# Patient Record
Sex: Male | Born: 1970 | Race: White | Hispanic: No | Marital: Single | Smoking: Current every day smoker
Health system: Southern US, Community
[De-identification: ages and names within clinical notes are randomized; demographics above are authoritative.]

## PROBLEM LIST (undated history)

## (undated) DIAGNOSIS — I2510 Atherosclerotic heart disease of native coronary artery without angina pectoris: Secondary | ICD-10-CM

## (undated) DIAGNOSIS — E7800 Pure hypercholesterolemia, unspecified: Secondary | ICD-10-CM

## (undated) DIAGNOSIS — E119 Type 2 diabetes mellitus without complications: Secondary | ICD-10-CM

## (undated) DIAGNOSIS — J449 Chronic obstructive pulmonary disease, unspecified: Secondary | ICD-10-CM

## (undated) DIAGNOSIS — J45909 Unspecified asthma, uncomplicated: Secondary | ICD-10-CM

## (undated) DIAGNOSIS — I10 Essential (primary) hypertension: Secondary | ICD-10-CM

## (undated) HISTORY — PX: CARDIAC CATHETERIZATION: SHX172

---

## 2017-12-17 ENCOUNTER — Inpatient Hospital Stay
Admission: EM | Admit: 2017-12-17 | Discharge: 2017-12-18 | DRG: 683 | Disposition: A | Discharge status: Home / Self Care | Payer: Medicaid Other | Attending: Internal Medicine | Admitting: Internal Medicine

## 2017-12-17 ENCOUNTER — Encounter: Payer: Self-pay | Admitting: Emergency Medicine

## 2017-12-17 ENCOUNTER — Emergency Department: Payer: Medicaid Other

## 2017-12-17 ENCOUNTER — Other Ambulatory Visit: Payer: Self-pay

## 2017-12-17 ENCOUNTER — Inpatient Hospital Stay: Payer: Medicaid Other

## 2017-12-17 DIAGNOSIS — I2510 Atherosclerotic heart disease of native coronary artery without angina pectoris: Secondary | ICD-10-CM | POA: Diagnosis present

## 2017-12-17 DIAGNOSIS — E861 Hypovolemia: Secondary | ICD-10-CM | POA: Diagnosis present

## 2017-12-17 DIAGNOSIS — Z716 Tobacco abuse counseling: Secondary | ICD-10-CM

## 2017-12-17 DIAGNOSIS — W1830XA Fall on same level, unspecified, initial encounter: Secondary | ICD-10-CM | POA: Diagnosis present

## 2017-12-17 DIAGNOSIS — E785 Hyperlipidemia, unspecified: Secondary | ICD-10-CM | POA: Diagnosis present

## 2017-12-17 DIAGNOSIS — E119 Type 2 diabetes mellitus without complications: Secondary | ICD-10-CM | POA: Diagnosis present

## 2017-12-17 DIAGNOSIS — F1410 Cocaine abuse, uncomplicated: Secondary | ICD-10-CM | POA: Diagnosis present

## 2017-12-17 DIAGNOSIS — E7800 Pure hypercholesterolemia, unspecified: Secondary | ICD-10-CM | POA: Diagnosis present

## 2017-12-17 DIAGNOSIS — I9589 Other hypotension: Secondary | ICD-10-CM | POA: Diagnosis present

## 2017-12-17 DIAGNOSIS — I10 Essential (primary) hypertension: Secondary | ICD-10-CM | POA: Diagnosis present

## 2017-12-17 DIAGNOSIS — E669 Obesity, unspecified: Secondary | ICD-10-CM | POA: Diagnosis present

## 2017-12-17 DIAGNOSIS — J449 Chronic obstructive pulmonary disease, unspecified: Secondary | ICD-10-CM | POA: Diagnosis present

## 2017-12-17 DIAGNOSIS — K529 Noninfective gastroenteritis and colitis, unspecified: Secondary | ICD-10-CM | POA: Diagnosis present

## 2017-12-17 DIAGNOSIS — N179 Acute kidney failure, unspecified: Principal | ICD-10-CM | POA: Diagnosis present

## 2017-12-17 DIAGNOSIS — M79601 Pain in right arm: Secondary | ICD-10-CM | POA: Diagnosis present

## 2017-12-17 DIAGNOSIS — E871 Hypo-osmolality and hyponatremia: Secondary | ICD-10-CM | POA: Diagnosis present

## 2017-12-17 DIAGNOSIS — F17210 Nicotine dependence, cigarettes, uncomplicated: Secondary | ICD-10-CM | POA: Diagnosis present

## 2017-12-17 DIAGNOSIS — E860 Dehydration: Secondary | ICD-10-CM | POA: Diagnosis present

## 2017-12-17 DIAGNOSIS — M25559 Pain in unspecified hip: Secondary | ICD-10-CM

## 2017-12-17 DIAGNOSIS — R55 Syncope and collapse: Secondary | ICD-10-CM | POA: Diagnosis present

## 2017-12-17 DIAGNOSIS — M25519 Pain in unspecified shoulder: Secondary | ICD-10-CM

## 2017-12-17 DIAGNOSIS — M25552 Pain in left hip: Secondary | ICD-10-CM | POA: Diagnosis present

## 2017-12-17 HISTORY — DX: Essential (primary) hypertension: I10

## 2017-12-17 HISTORY — DX: Chronic obstructive pulmonary disease, unspecified: J44.9

## 2017-12-17 HISTORY — DX: Pure hypercholesterolemia, unspecified: E78.00

## 2017-12-17 HISTORY — DX: Atherosclerotic heart disease of native coronary artery without angina pectoris: I25.10

## 2017-12-17 HISTORY — DX: Unspecified asthma, uncomplicated: J45.909

## 2017-12-17 HISTORY — DX: Type 2 diabetes mellitus without complications: E11.9

## 2017-12-17 HISTORY — DX: Cystic fibrosis, unspecified: E84.9

## 2017-12-17 LAB — COMPREHENSIVE METABOLIC PANEL
ALT: 36 U/L (ref 0–44)
ANION GAP: 11 (ref 5–15)
AST: 48 U/L — ABNORMAL HIGH (ref 15–41)
Albumin: 3.6 g/dL (ref 3.5–5.0)
Alkaline Phosphatase: 45 U/L (ref 38–126)
BUN: 74 mg/dL — ABNORMAL HIGH (ref 6–20)
CHLORIDE: 96 mmol/L — AB (ref 98–111)
CO2: 25 mmol/L (ref 22–32)
Calcium: 8.2 mg/dL — ABNORMAL LOW (ref 8.9–10.3)
Creatinine, Ser: 2.62 mg/dL — ABNORMAL HIGH (ref 0.61–1.24)
GFR, EST AFRICAN AMERICAN: 32 mL/min — AB (ref >60)
GFR, EST NON AFRICAN AMERICAN: 28 mL/min — AB (ref >60)
Glucose, Bld: 216 mg/dL — ABNORMAL HIGH (ref 70–99)
POTASSIUM: 3.9 mmol/L (ref 3.5–5.1)
Sodium: 132 mmol/L — ABNORMAL LOW (ref 135–145)
TOTAL PROTEIN: 6.6 g/dL (ref 6.5–8.1)
Total Bilirubin: 0.9 mg/dL (ref 0.3–1.2)

## 2017-12-17 LAB — GLUCOSE, CAPILLARY
GLUCOSE-CAPILLARY: 109 mg/dL — AB (ref 70–99)
GLUCOSE-CAPILLARY: 173 mg/dL — AB (ref 70–99)

## 2017-12-17 LAB — CBC WITH DIFFERENTIAL/PLATELET
BASOS ABS: 0 10*3/uL (ref 0–0.1)
BASOS PCT: 0 %
Eosinophils Absolute: 0.3 10*3/uL (ref 0–0.7)
Eosinophils Relative: 3 %
HCT: 35.5 % — ABNORMAL LOW (ref 40.0–52.0)
HEMOGLOBIN: 12.6 g/dL — AB (ref 13.0–18.0)
Lymphocytes Relative: 24 %
Lymphs Abs: 2.4 10*3/uL (ref 1.0–3.6)
MCH: 32.8 pg (ref 26.0–34.0)
MCHC: 35.3 g/dL (ref 32.0–36.0)
MCV: 92.9 fL (ref 80.0–100.0)
Monocytes Absolute: 1.2 10*3/uL — ABNORMAL HIGH (ref 0.2–1.0)
Monocytes Relative: 11 %
NEUTROS ABS: 6.3 10*3/uL (ref 1.4–6.5)
Neutrophils Relative %: 62 %
Platelets: 213 10*3/uL (ref 150–440)
RBC: 3.83 MIL/uL — ABNORMAL LOW (ref 4.40–5.90)
RDW: 12.9 % (ref 11.5–14.5)
WBC: 10.2 10*3/uL (ref 3.8–10.6)

## 2017-12-17 LAB — URINALYSIS, COMPLETE (UACMP) WITH MICROSCOPIC
BILIRUBIN URINE: NEGATIVE
Bacteria, UA: NONE SEEN
Glucose, UA: 50 mg/dL — AB
HGB URINE DIPSTICK: NEGATIVE
KETONES UR: NEGATIVE mg/dL
LEUKOCYTES UA: NEGATIVE
NITRITE: NEGATIVE
PH: 5 (ref 5.0–8.0)
Protein, ur: NEGATIVE mg/dL
SPECIFIC GRAVITY, URINE: 1.014 (ref 1.005–1.030)

## 2017-12-17 LAB — URINE DRUG SCREEN, QUALITATIVE (ARMC ONLY)
AMPHETAMINES, UR SCREEN: NOT DETECTED
BENZODIAZEPINE, UR SCRN: NOT DETECTED
Barbiturates, Ur Screen: NOT DETECTED
Cannabinoid 50 Ng, Ur ~~LOC~~: NOT DETECTED
Cocaine Metabolite,Ur ~~LOC~~: POSITIVE — AB
MDMA (ECSTASY) UR SCREEN: NOT DETECTED
METHADONE SCREEN, URINE: NOT DETECTED
Opiate, Ur Screen: NOT DETECTED
Phencyclidine (PCP) Ur S: NOT DETECTED
TRICYCLIC, UR SCREEN: NOT DETECTED

## 2017-12-17 LAB — TROPONIN I

## 2017-12-17 LAB — LACTIC ACID, PLASMA: LACTIC ACID, VENOUS: 1.5 mmol/L (ref 0.5–1.9)

## 2017-12-17 MED ORDER — NICOTINE 21 MG/24HR TD PT24
21.0000 mg | MEDICATED_PATCH | Freq: Once | TRANSDERMAL | Status: DC
  Administered 2017-12-17: 21 mg via TRANSDERMAL
  Filled 2017-12-17: qty 1

## 2017-12-17 MED ORDER — INFLUENZA VAC SPLIT QUAD 0.5 ML IM SUSY: 0.5000 mL | PREFILLED_SYRINGE | INTRAMUSCULAR | Status: DC

## 2017-12-17 MED ORDER — ACETAMINOPHEN 650 MG RE SUPP: 650.0000 mg | Freq: Four times a day (QID) | RECTAL | Status: DC | PRN

## 2017-12-17 MED ORDER — SODIUM CHLORIDE 0.9 % IV BOLUS
500.0000 mL | Freq: Once | INTRAVENOUS | Status: AC
  Administered 2017-12-17: 500 mL via INTRAVENOUS

## 2017-12-17 MED ORDER — NICOTINE 21 MG/24HR TD PT24
21.0000 mg | MEDICATED_PATCH | Freq: Every day | TRANSDERMAL | Status: DC
  Filled 2017-12-17: qty 1

## 2017-12-17 MED ORDER — NICOTINE 14 MG/24HR TD PT24: 14.0000 mg | MEDICATED_PATCH | Freq: Every day | TRANSDERMAL | Status: DC

## 2017-12-17 MED ORDER — ENOXAPARIN SODIUM 40 MG/0.4ML ~~LOC~~ SOLN
40.0000 mg | SUBCUTANEOUS | Status: DC
  Administered 2017-12-17: 40 mg via SUBCUTANEOUS
  Filled 2017-12-17: qty 0.4

## 2017-12-17 MED ORDER — ONDANSETRON HCL 4 MG PO TABS: 4.0000 mg | ORAL_TABLET | Freq: Four times a day (QID) | ORAL | Status: DC | PRN

## 2017-12-17 MED ORDER — ONDANSETRON HCL 4 MG/2ML IJ SOLN: 4.0000 mg | Freq: Four times a day (QID) | INTRAMUSCULAR | Status: DC | PRN

## 2017-12-17 MED ORDER — INSULIN ASPART 100 UNIT/ML ~~LOC~~ SOLN: 0.0000 [IU] | Freq: Every day | SUBCUTANEOUS | Status: DC

## 2017-12-17 MED ORDER — ACETAMINOPHEN 325 MG PO TABS
650.0000 mg | ORAL_TABLET | Freq: Four times a day (QID) | ORAL | Status: DC | PRN
  Administered 2017-12-17 – 2017-12-18 (×2): 650 mg via ORAL
  Filled 2017-12-17 (×2): qty 2

## 2017-12-17 MED ORDER — MIDODRINE HCL 5 MG PO TABS
5.0000 mg | ORAL_TABLET | Freq: Three times a day (TID) | ORAL | Status: DC
  Administered 2017-12-17 – 2017-12-18 (×2): 5 mg via ORAL
  Filled 2017-12-17 (×3): qty 1

## 2017-12-17 MED ORDER — INSULIN ASPART 100 UNIT/ML ~~LOC~~ SOLN: 0.0000 [IU] | Freq: Three times a day (TID) | SUBCUTANEOUS | Status: DC

## 2017-12-17 MED ORDER — LORAZEPAM 2 MG/ML IJ SOLN: 1.0000 mg | INTRAMUSCULAR | Status: DC | PRN

## 2017-12-17 MED ORDER — ENOXAPARIN SODIUM 40 MG/0.4ML ~~LOC~~ SOLN: 30.0000 mg | SUBCUTANEOUS | Status: DC

## 2017-12-17 MED ORDER — SODIUM CHLORIDE 0.9 % IV SOLN
INTRAVENOUS | Status: DC
  Administered 2017-12-17 – 2017-12-18 (×3): via INTRAVENOUS

## 2017-12-17 MED ORDER — SODIUM CHLORIDE 0.9 % IV BOLUS
1000.0000 mL | Freq: Once | INTRAVENOUS | Status: AC
  Administered 2017-12-17: 1000 mL via INTRAVENOUS

## 2017-12-17 NOTE — Progress Notes (Signed)
Anticoagulation monitoring(Lovenox):  47yo  male ordered Lovenox 30 mg Q24h for DVT prevention.  Filed Weights   12/17/17 1114  Weight: 300 lb (136.1 kg)   BMI 36.53   Lab Results  Component Value Date   CREATININE 2.62 (H) 12/17/2017   Estimated Creatinine Clearance: 54.2 mL/min (A) (by C-G formula based on SCr of 2.62 mg/dL (H)). Hemoglobin & Hematocrit     Component Value Date/Time   HGB 12.6 (L) 12/17/2017 1146   HCT 35.5 (L) 12/17/2017 1146     Per Protocol for Patient with estCrcl > 30 ml/min and BMI < 40, will transition to Lovenox 40 mg Q24h.     Clovia CuffLisa Edmar Blankenburg, PharmD, BCPS 12/17/2017 4:02 PM

## 2017-12-17 NOTE — ED Triage Notes (Signed)
C/O dizziness  X 1 week.  Also reports 4-5 syncopal episodes in the past 3 months, last episode yesterday.  C/O diarrhea this week.

## 2017-12-17 NOTE — Progress Notes (Signed)
Family Meeting Note  Advance Directive:yes  Today a meeting took place with the Patient.    The following clinical team members were present during this meeting:MD  The following were discussed:Patient's diagnosis: AKI , hyponatremia, dehydration, syncopal episode, acute right arm pain and acute left hip pain, positive urine drug screen with cocaine, tobacco abuse disorder other comorbidities, treatment plan of care discussed in detail with the patient  Asthma    . COPD (chronic obstructive pulmonary disease) (HCC)   . Coronary artery disease   . Cystic fibrosis (HCC)   . Diabetes mellitus without complication (HCC)   . High cholesterol   . Hypertension      Patient's progosis: Unable to determine and Goals for treatment: Full Code  Sister Lajuana MatteBonita Johnson is a healthcare.  Additional follow-up to be provided: Hospitalist  Time spent during discussion:17 MIN  Ramonita LabAruna Brigid Vandekamp, MD

## 2017-12-17 NOTE — H&P (Signed)
[facility] [place] at [facility]   PATIENT NAME: Shawn Welch    MR#:  [id number]  DATE OF BIRTH:  [birth year]  DATE OF ADMISSION:  12/17/2017  PRIMARY CARE PHYSICIAN: Patient, No Pcp Per   REQUESTING/REFERRING PHYSICIAN: Dr. Roxan Hockeyobinson  CHIEF COMPLAINT:  Passsed out   HISTORY OF PRESENT ILLNESS:  Shawn Welch  is a 47 y.o. male with a known history of chronic COPD continues to smoke half pack a day, diabetes mellitus, hyperlipidemia, hypertension and obesity came into the ED after he had a syncopal episode last night.  Patient was reporting he was having nausea and vomiting through the weekend and feeling dizzy yesterday and had a syncopal episode last night.  Patient managed to go to bed last night and came to the hospital ED today.  Patient also had a history of chronic kidney disease in the past but recentl she is seeny his renal function was back to normal and he stopped going to nephrologist.  He does not know his baseline.  Reporting right shoulder and left hip pain.  Denies any abdominal pain or diarrhea at this point  PAST MEDICAL HISTORY:   Past Medical History:  Diagnosis Date  . Asthma   . COPD (chronic obstructive pulmonary disease) (HCC)   . Coronary artery disease   . Cystic fibrosis (HCC)   . Diabetes mellitus without complication (HCC)   . High cholesterol   . Hypertension     PAST SURGICAL HISTOIRY:   Past Surgical History:  Procedure Laterality Date  . CARDIAC CATHETERIZATION      SOCIAL HISTORY:   Social History   Tobacco Use  . Smoking status: Current Every Day Smoker    Types: Cigarettes  . Smokeless tobacco: Never Used  Substance Use Topics  . Alcohol use: Not on file    FAMILY HISTORY:  No family history on file.  DRUG ALLERGIES:  No Known Allergies  REVIEW OF SYSTEMS:  CONSTITUTIONAL: No fever, fatigue or weakness.  EYES: No blurred or double vision.  EARS, NOSE, AND THROAT: No tinnitus or ear pain.   RESPIRATORY: No cough, shortness of breath, wheezing or hemoptysis.  CARDIOVASCULAR: No chest pain, orthopnea, edema.  GASTROINTESTINAL: No nausea, vomiting, diarrhea or abdominal pain.  GENITOURINARY: No dysuria, hematuria.  ENDOCRINE: No polyuria, nocturia,  HEMATOLOGY: No anemia, easy bruising or bleeding SKIN: No rash or lesion. MUSCULOSKELETAL: Reporting right arm and left hip pain no joint pain or arthritis.   NEUROLOGIC: No tingling, numbness, weakness.  PSYCHIATRY: No anxiety or depression.   MEDICATIONS AT HOME:   Prior to Admission medications   Not on File      VITAL SIGNS:  Blood pressure 104/62, pulse 84, temperature 98 F (36.7 C), temperature source Oral, resp. rate 11, height 6\' 4"  (1.93 m), weight 136.1 kg, SpO2 93 %.  PHYSICAL EXAMINATION:  GENERAL:  47 y.o.-year-old patient lying in the bed with no acute distress.  EYES: Pupils equal, round, reactive to light and accommodation. No scleral icterus. Extraocular muscles intact.  HEENT: Head atraumatic, normocephalic. Oropharynx and nasopharynx clear.  NECK:  Supple, no jugular venous distention. No thyroid enlargement, no tenderness.  LUNGS: Normal breath sounds bilaterally, no wheezing, rales,rhonchi or crepitation. No use of accessory muscles of respiration.  CARDIOVASCULAR: S1, S2 normal. No murmurs, rubs, or gallops.  ABDOMEN: Soft, nontender, nondistended. Bowel sounds present. No organomegaly or mass.  EXTREMITIES: Right arm and left hip are tender with limited range of motion no pedal edema, cyanosis,  or clubbing.  NEUROLOGIC: Cranial nerves II through XII are intact. Sensation intact. Gait not checked.  PSYCHIATRIC: The patient is alert and oriented x 3.  SKIN: No obvious rash, lesion, or ulcer.   LABORATORY PANEL:   CBC Recent Labs  Lab 12/17/17 1146  WBC 10.2  HGB 12.6*  HCT 35.5*  PLT 213    ------------------------------------------------------------------------------------------------------------------  Chemistries  Recent Labs  Lab 12/17/17 1146  NA 132*  K 3.9  CL 96*  CO2 25  GLUCOSE 216*  BUN 74*  CREATININE 2.62*  CALCIUM 8.2*  AST 48*  ALT 36  ALKPHOS 45  BILITOT 0.9   ------------------------------------------------------------------------------------------------------------------  Cardiac Enzymes Recent Labs  Lab 12/17/17 1146  TROPONINI <0.03   ------------------------------------------------------------------------------------------------------------------  RADIOLOGY:  Dg Chest 2 View  Result Date: 12/17/2017 CLINICAL DATA:  C/O dizziness X 1 week. Also reports 4-5 syncopal episodes in the past 3 months, last episode yesterday.C/O diarrhea this week. COPD, cystic fibrosis, CAD, DM EXAM: CHEST - 2 VIEW COMPARISON:  None. FINDINGS: Cardiac silhouette is normal in size and configuration. No mediastinal or hilar masses. No evidence of adenopathy. Clear lungs.  No pleural effusion or pneumothorax. Skeletal structures are intact IMPRESSION: No active cardiopulmonary disease. Electronically Signed   By: Amie Portland M.D.   On: 12/17/2017 12:25   Dg Tibia/fibula Left  Result Date: 12/17/2017 CLINICAL DATA:  Left lower leg pain with abrasions. Multiple syncopal episodes. Patient fell yesterday. EXAM: LEFT TIBIA AND FIBULA - 2 VIEW COMPARISON:  None. FINDINGS: The mineralization and alignment are normal. There is no evidence of acute fracture or dislocation. There are mild tricompartmental degenerative changes at the knee. The joint spaces appear adequately maintained at the ankle. No focal soft tissue swelling, foreign body or emphysema identified. IMPRESSION: No acute osseous findings or focal soft tissue abnormalities identified. Electronically Signed   By: Carey Bullocks M.D.   On: 12/17/2017 14:30   US Renal  Result Date: 12/17/2017 CLINICAL DATA:   Acute kidney injury EXAM: RENAL / URINARY TRACT ULTRASOUND COMPLETE COMPARISON:  None. FINDINGS: Right Kidney: Length: 11.0 cm.  No mass or hydronephrosis. Left Kidney: Length: 12.2 cm.  No mass or hydronephrosis. Bladder: Within normal limits. IMPRESSION: Negative renal ultrasound. Electronically Signed   By: Charline Bills M.D.   On: 12/17/2017 13:51    EKG:   Orders placed or performed during the hospital encounter of 12/17/17  . ED EKG 12-Lead  . ED EKG 12-Lead  . EKG 12-Lead  . EKG 12-Lead    IMPRESSION AND PLAN:     # AKI -probably from prerenal from dehydration secondary to intractable nausea and vomiting through the weekend Admit to MedSurg unit IV fluids  avoid nephrotoxins, renal dose other medications Renal ultrasound normal  #Acute gastroenteritis-probably viral Clinically improving hydrate with IV fluids  #Hyponatremia from dehydration Hydrate with IV fluids and recheck in a.m.  #Syncope-from dehydration Hydrate with IV fluids Check orthostatics Check troponins  #Acute right arm and left hip pain from the fall Get x-rays to rule out fracture  #Tobacco abuse disorder counseled patient to quit smoking for 5 minutes nicotine patch will be provided  #Cocaine abuse Urine drug screen is positive.  Outpatient drug rehab follow-up is recommended  #?  Cystic fibrosis-primary care physician to follow-up   All the records are reviewed and case discussed with ED provider. Management plans discussed with the patient, family and they are in agreement.  CODE STATUS: FC  TOTAL TIME TAKING CARE OF THIS PATIENT: 14  minutes.   Note: This dictation was prepared with Dragon dictation along with smaller phrase technology. Any transcriptional errors that result from this process are unintentional.  Ramonita Lab M.D on 12/17/2017 at 2:37 PM  Between 7am to 6pm - Pager - [phone]  After 6pm go to [email or web address] - password [email or web address]  [facility]   Office  [phone]  CC: Primary care physician; Patient, No Pcp Per

## 2017-12-17 NOTE — ED Notes (Signed)
Patient transported to Ultrasound 

## 2017-12-17 NOTE — ED Notes (Signed)
Patient transported to X-ray 

## 2017-12-17 NOTE — Progress Notes (Signed)
Dr Allena KatzPatel notified pt low bp 88/41 and agitation, orders given

## 2017-12-17 NOTE — ED Provider Notes (Signed)
[facility] Emergency Department Provider Note    First MD Initiated Contact with Patient 12/17/17 1127     (approximate)  I have reviewed the triage vital signs and the nursing notes.   HISTORY  Chief Complaint Weakness, syncope   HPI Shawn Welch is a 47 y.o. male history of asthma COPD reportedly has a history of cystic fibrosis as well as diabetes resents to the ER  for evaluation of frequent syncopal episodes and blacking out.  States he is done this before.  Does feel very dehydrated very thirsty.  Denies any chest pain.  Denies any shortness of breath.  Has had nonproductive cough.  Denies any medication changes.  Has noted darker colored and decreased urine output.   Past Medical History:  Diagnosis Date  . Asthma   . COPD (chronic obstructive pulmonary disease) (HCC)   . Coronary artery disease   . Cystic fibrosis (HCC)   . Diabetes mellitus without complication (HCC)   . High cholesterol   . Hypertension    No family history on file. Past Surgical History:  Procedure Laterality Date  . CARDIAC CATHETERIZATION     Patient Active Problem List   Diagnosis Date Noted  . AKI (acute kidney injury) (HCC) 12/17/2017      Prior to Admission medications   Not on File    Allergies Patient has no known allergies.    Social History Social History   Tobacco Use  . Smoking status: Current Every Day Smoker    Types: Cigarettes  . Smokeless tobacco: Never Used  Substance Use Topics  . Alcohol use: Not on file  . Drug use: Not on file    Review of Systems Patient denies headaches, rhinorrhea, blurry vision, numbness, shortness of breath, chest pain, edema, cough, abdominal pain, nausea, vomiting, diarrhea, dysuria, fevers, rashes or hallucinations unless otherwise stated above in HPI. ____________________________________________   PHYSICAL EXAM:  VITAL SIGNS: Vitals:   12/17/17 1330 12/17/17 1401  BP: 114/61   Pulse: 79 86   Resp: 11 13  Temp:    SpO2: 94% 95%    Constitutional: Alert and oriented.  Eyes: Conjunctivae are normal.  Head: Atraumatic. Nose: No congestion/rhinnorhea. Mouth/Throat: Mucous membranes are moist.   Neck: No stridor. Painless ROM.  Cardiovascular: Normal rate, regular rhythm. Grossly normal heart sounds.  Good peripheral circulation. Respiratory: Normal respiratory effort.  No retractions. Lungs CTAB. Gastrointestinal: Soft and nontender. No distention. No abdominal bruits. No CVA tenderness. Genitourinary:  Musculoskeletal: No lower extremity tenderness nor edema.  No joint effusions. Neurologic:  Normal speech and language. No gross focal neurologic deficits are appreciated. No facial droop Skin:  Skin is warm, dry and intact. No rash noted. Psychiatric: Mood and affect are normal. Speech and behavior are normal.  ____________________________________________   LABS (all labs ordered are listed, but only abnormal results are displayed)  Results for orders placed or performed during the hospital encounter of 12/17/17 (from the past 24 hour(s))  Lactic acid, plasma     Status: None   Collection Time: 12/17/17 11:46 AM  Result Value Ref Range   Lactic Acid, Venous 1.5 0.5 - 1.9 mmol/L  Comprehensive metabolic panel     Status: Abnormal   Collection Time: 12/17/17 11:46 AM  Result Value Ref Range   Sodium 132 (L) 135 - 145 mmol/L   Potassium 3.9 3.5 - 5.1 mmol/L   Chloride 96 (L) 98 - 111 mmol/L   CO2 25 22 - 32 mmol/L  Glucose, Bld 216 (H) 70 - 99 mg/dL   BUN 74 (H) 6 - 20 mg/dL   Creatinine, Ser 1.612.62 (H) 0.61 - 1.24 mg/dL   Calcium 8.2 (L) 8.9 - 10.3 mg/dL   Total Protein 6.6 6.5 - 8.1 g/dL   Albumin 3.6 3.5 - 5.0 g/dL   AST 48 (H) 15 - 41 U/L   ALT 36 0 - 44 U/L   Alkaline Phosphatase 45 38 - 126 U/L   Total Bilirubin 0.9 0.3 - 1.2 mg/dL   GFR calc non Af Amer 28 (L) >60 mL/min   GFR calc Af Amer 32 (L) >60 mL/min   Anion gap 11 5 - 15  Troponin I     Status:  None   Collection Time: 12/17/17 11:46 AM  Result Value Ref Range   Troponin I <0.03 <0.03 ng/mL  CBC WITH DIFFERENTIAL     Status: Abnormal   Collection Time: 12/17/17 11:46 AM  Result Value Ref Range   WBC 10.2 3.8 - 10.6 K/uL   RBC 3.83 (L) 4.40 - 5.90 MIL/uL   Hemoglobin 12.6 (L) 13.0 - 18.0 g/dL   HCT 09.635.5 (L) 04.540.0 - 40.952.0 %   MCV 92.9 80.0 - 100.0 fL   MCH 32.8 26.0 - 34.0 pg   MCHC 35.3 32.0 - 36.0 g/dL   RDW 81.112.9 91.411.5 - 78.214.5 %   Platelets 213 150 - 440 K/uL   Neutrophils Relative % 62 %   Neutro Abs 6.3 1.4 - 6.5 K/uL   Lymphocytes Relative 24 %   Lymphs Abs 2.4 1.0 - 3.6 K/uL   Monocytes Relative 11 %   Monocytes Absolute 1.2 (H) 0.2 - 1.0 K/uL   Eosinophils Relative 3 %   Eosinophils Absolute 0.3 0 - 0.7 K/uL   Basophils Relative 0 %   Basophils Absolute 0.0 0 - 0.1 K/uL  Urinalysis, Complete w Microscopic     Status: Abnormal   Collection Time: 12/17/17 12:47 PM  Result Value Ref Range   Color, Urine YELLOW (A) YELLOW   APPearance CLEAR (A) CLEAR   Specific Gravity, Urine 1.014 1.005 - 1.030   pH 5.0 5.0 - 8.0   Glucose, UA 50 (A) NEGATIVE mg/dL   Hgb urine dipstick NEGATIVE NEGATIVE   Bilirubin Urine NEGATIVE NEGATIVE   Ketones, ur NEGATIVE NEGATIVE mg/dL   Protein, ur NEGATIVE NEGATIVE mg/dL   Nitrite NEGATIVE NEGATIVE   Leukocytes, UA NEGATIVE NEGATIVE   RBC / HPF 0-5 0 - 5 RBC/hpf   WBC, UA 0-5 0 - 5 WBC/hpf   Bacteria, UA NONE SEEN NONE SEEN   Squamous Epithelial / LPF 0-5 0 - 5  Urine Drug Screen, Qualitative ([facility] only)     Status: Abnormal   Collection Time: 12/17/17 12:47 PM  Result Value Ref Range   Tricyclic, Ur Screen NONE DETECTED NONE DETECTED   Amphetamines, Ur Screen NONE DETECTED NONE DETECTED   MDMA (Ecstasy)Ur Screen NONE DETECTED NONE DETECTED   Cocaine Metabolite,Ur [place] POSITIVE (A) NONE DETECTED   Opiate, Ur Screen NONE DETECTED NONE DETECTED   Phencyclidine (PCP) Ur S NONE DETECTED NONE DETECTED   Cannabinoid 50 Ng, Ur [place] NONE  DETECTED NONE DETECTED   Barbiturates, Ur Screen NONE DETECTED NONE DETECTED   Benzodiazepine, Ur Scrn NONE DETECTED NONE DETECTED   Methadone Scn, Ur NONE DETECTED NONE DETECTED  Blood gas, venous     Status: None (Preliminary result)   Collection Time: 12/17/17 12:53 PM  Result Value Ref Range  pH, Ven 7.26 7.250 - 7.430   pCO2, Ven 57 44.0 - 60.0 mmHg   pO2, Ven PENDING 32.0 - 45.0 mmHg   Patient temperature 37.0    Collection site VENOUS    Sample type VENOUS    ____________________________________________  EKG My review and personal interpretation at Time: 11:42   Indication: syncope  Rate: 85  Rhythm: sinus Axis: normal Other: normal intervals, no stemi ____________________________________________  RADIOLOGY  I personally reviewed all radiographic images ordered to evaluate for the above acute complaints and reviewed radiology reports and findings.  These findings were personally discussed with the patient.  Please see medical record for radiology report.  ____________________________________________   PROCEDURES  Procedure(s) performed:  .Critical Care Performed by: Willy Eddy, MD Authorized by: Willy Eddy, MD   Critical care provider statement:    Critical care time (minutes):  41   Critical care time was exclusive of:  Separately billable procedures and treating other patients   Critical care was necessary to treat or prevent imminent or life-threatening deterioration of the following conditions:  Renal failure and dehydration   Critical care was time spent personally by me on the following activities:  Development of treatment plan with patient or surrogate, discussions with consultants, evaluation of patient's response to treatment, examination of patient, obtaining history from patient or surrogate, ordering and performing treatments and interventions, ordering and review of laboratory studies, ordering and review of radiographic studies, pulse  oximetry, re-evaluation of patient's condition and review of old charts      Critical Care performed: yes ____________________________________________   INITIAL IMPRESSION / ASSESSMENT AND PLAN / ED COURSE  Pertinent labs & imaging results that were available during my care of the patient were reviewed by me and considered in my medical decision making (see chart for details).   DDX: Dehydration, AKI, lecture light abnormality, anemia, dysrhythmia  Gibbs Naugle is a 47 y.o. who presents to the ED with symptoms as described above.  Patient found to be hypotensive.  No fever.  Abdominal exam is benign.  No evidence of significant EKG changes.  Blood work will be ordered for the above differential.  Will start IV fluids for resuscitation as the patient does appear her dry.  The patient will be placed on continuous pulse oximetry and telemetry for monitoring.  Laboratory evaluation will be sent to evaluate for the above complaints.     Clinical Course as of Dec 18 1411  Sun Dec 17, 2017  1409 Renal ultrasound is normal.  He is tested positive for cocaine.  Suspect this is likely component of his symptoms.  We will continue with IV hydration and admission the hospital for IV fluids.   [PR]    Clinical Course User Index [PR] Willy Eddy, MD     As part of my medical decision making, I reviewed the following data within the electronic MEDICAL RECORD NUMBER Nursing notes reviewed and incorporated, Labs reviewed, notes from prior ED visits.   ____________________________________________   FINAL CLINICAL IMPRESSION(S) / ED DIAGNOSES  Final diagnoses:  AKI (acute kidney injury) (HCC)  Hypotension due to hypovolemia      NEW MEDICATIONS STARTED DURING THIS VISIT:  New Prescriptions   No medications on file     Note:  This document was prepared using Dragon voice recognition software and may include unintentional dictation errors.    Willy Eddy, MD 12/17/17  [phone]

## 2017-12-17 NOTE — ED Notes (Signed)
POC: (sister) Madaline GuthrieBuffie Jonerik [phone]

## 2017-12-18 LAB — COMPREHENSIVE METABOLIC PANEL
ALK PHOS: 42 U/L (ref 38–126)
ALT: 33 U/L (ref 0–44)
AST: 36 U/L (ref 15–41)
Albumin: 3.6 g/dL (ref 3.5–5.0)
Anion gap: 5 (ref 5–15)
BILIRUBIN TOTAL: 0.8 mg/dL (ref 0.3–1.2)
BUN: 41 mg/dL — AB (ref 6–20)
CO2: 27 mmol/L (ref 22–32)
CREATININE: 1.01 mg/dL (ref 0.61–1.24)
Calcium: 8.4 mg/dL — ABNORMAL LOW (ref 8.9–10.3)
Chloride: 104 mmol/L (ref 98–111)
GFR calc Af Amer: >60 mL/min (ref >60)
GLUCOSE: 149 mg/dL — AB (ref 70–99)
Potassium: 3.9 mmol/L (ref 3.5–5.1)
Sodium: 136 mmol/L (ref 135–145)
TOTAL PROTEIN: 6.5 g/dL (ref 6.5–8.1)

## 2017-12-18 LAB — GLUCOSE, CAPILLARY: GLUCOSE-CAPILLARY: 120 mg/dL — AB (ref 70–99)

## 2017-12-18 LAB — CBC
HCT: 34.6 % — ABNORMAL LOW (ref 40.0–52.0)
Hemoglobin: 12.2 g/dL — ABNORMAL LOW (ref 13.0–18.0)
MCH: 32.6 pg (ref 26.0–34.0)
MCHC: 35.2 g/dL (ref 32.0–36.0)
MCV: 92.6 fL (ref 80.0–100.0)
PLATELETS: 201 10*3/uL (ref 150–440)
RBC: 3.74 MIL/uL — ABNORMAL LOW (ref 4.40–5.90)
RDW: 12.8 % (ref 11.5–14.5)
WBC: 7.8 10*3/uL (ref 3.8–10.6)

## 2017-12-18 LAB — BLOOD GAS, VENOUS
Patient temperature: 37
pCO2, Ven: 57 mmHg (ref 44.0–60.0)
pH, Ven: 7.26 (ref 7.250–7.430)

## 2017-12-18 LAB — HEMOGLOBIN A1C
Hgb A1c MFr Bld: 7.2 % — ABNORMAL HIGH (ref 4.8–5.6)
MEAN PLASMA GLUCOSE: 159.94 mg/dL

## 2017-12-18 MED ORDER — ALUM & MAG HYDROXIDE-SIMETH 200-200-20 MG/5ML PO SUSP
30.0000 mL | ORAL | Status: DC | PRN
  Administered 2017-12-18: 30 mL via ORAL
  Filled 2017-12-18: qty 30

## 2017-12-18 NOTE — Discharge Summary (Signed)
[facility] [place] at [facility]   PATIENT NAME: Shawn Welch    MR#:  [id number]  DATE OF BIRTH:  04-08-1970  DATE OF ADMISSION:  12/17/2017   ADMITTING PHYSICIAN: Ramonita LabAruna Gouru, MD  DATE OF DISCHARGE: 12/18/2017 11:44 AM  PRIMARY CARE PHYSICIAN: Patient, No Pcp Per   ADMISSION DIAGNOSIS:  Hip pain [M25.559] AKI (acute kidney injury) (HCC) [N17.9] Shoulder pain, acute [M25.519] Hypotension due to hypovolemia [I95.89, E86.1] DISCHARGE DIAGNOSIS:  Active Problems:   AKI (acute kidney injury) (HCC)  SECONDARY DIAGNOSIS:   Past Medical History:  Diagnosis Date  . Asthma   . COPD (chronic obstructive pulmonary disease) (HCC)   . Coronary artery disease   . Cystic fibrosis (HCC)   . Diabetes mellitus without complication (HCC)   . High cholesterol   . Hypertension    HOSPITAL COURSE:   # AKI -probably from prerenal from dehydration secondary to intractable nausea and vomiting through the weekend Improved with IV fluids  avoid nephrotoxins, renal dose other medications Renal ultrasound normal  #Acute gastroenteritis-probably viral or related to drug abuse. Improved.  #Hyponatremia from dehydration Improved with IV fluids and recheck in a.m.  #Syncope-from dehydration Normal troponins  #Acute right arm and left hip pain from the fall X-rays ruled out fracture  #Tobacco abuse disorder counseled patient to quit smoking for 5 minutes nicotine patch.  #Cocaine abuse Urine drug screen is positive.  The patient was counseled for drug abuse cessation. Outpatient drug rehab follow-up is recommended.  #?  Cystic fibrosis-primary care physician to follow-up  DISCHARGE CONDITIONS:  Stable, discharged to home today. CONSULTS OBTAINED:  Treatment Team:  Lamont DowdyKolluru, Sarath, MD DRUG ALLERGIES:  No Known Allergies DISCHARGE MEDICATIONS:   Allergies as of 12/18/2017   No Known Allergies     Medication List    TAKE these medications     Fluticasone-Salmeterol 250-50 MCG/DOSE Aepb Commonly known as:  ADVAIR Inhale 1 puff into the lungs 2 (two) times daily.   gabapentin 300 MG capsule Commonly known as:  NEURONTIN Take 300 mg by mouth 3 (three) times daily. Unknown strength   metFORMIN 850 MG tablet Commonly known as:  GLUCOPHAGE Take 850 mg by mouth 3 (three) times daily.   tiotropium 18 MCG inhalation capsule Commonly known as:  SPIRIVA Place 18 mcg into inhaler and inhale daily.        DISCHARGE INSTRUCTIONS:  See AVS. If you experience worsening of your admission symptoms, develop shortness of breath, life threatening emergency, suicidal or homicidal thoughts you must seek medical attention immediately by calling 911 or calling your MD immediately  if symptoms less severe.  You Must read complete instructions/literature along with all the possible adverse reactions/side effects for all the Medicines you take and that have been prescribed to you. Take any new Medicines after you have completely understood and accpet all the possible adverse reactions/side effects.   Please note  You were cared for by a hospitalist during your hospital stay. If you have any questions about your discharge medications or the care you received while you were in the hospital after you are discharged, you can call the unit and asked to speak with the hospitalist on call if the hospitalist that took care of you is not available. Once you are discharged, your primary care physician will handle any further medical issues. Please note that NO REFILLS for any discharge medications will be authorized once you are discharged, as it is imperative that you return to your  primary care physician (or establish a relationship with a primary care physician if you do not have one) for your aftercare needs so that they can reassess your need for medications and monitor your lab values.    On the day of Discharge:  VITAL SIGNS:  Blood pressure 112/63,  pulse 79, temperature 97.8 F (36.6 C), temperature source Oral, resp. rate 20, height 6\' 4"  (1.93 m), weight 136.1 kg, SpO2 95 %. PHYSICAL EXAMINATION:  GENERAL:  47 y.o.-year-old patient lying in the bed with no acute distress.  Obesity. EYES: Pupils equal, round, reactive to light and accommodation. No scleral icterus. Extraocular muscles intact.  HEENT: Head atraumatic, normocephalic. Oropharynx and nasopharynx clear.  NECK:  Supple, no jugular venous distention. No thyroid enlargement, no tenderness.  LUNGS: Normal breath sounds bilaterally, no wheezing, rales,rhonchi or crepitation. No use of accessory muscles of respiration.  CARDIOVASCULAR: S1, S2 normal. No murmurs, rubs, or gallops.  ABDOMEN: Soft, non-tender, non-distended. Bowel sounds present. No organomegaly or mass.  EXTREMITIES: No pedal edema, cyanosis, or clubbing.  NEUROLOGIC: Cranial nerves II through XII are intact. Muscle strength 5/5 in all extremities. Sensation intact. Gait not checked.  PSYCHIATRIC: The patient is alert and oriented x 3.  SKIN: No obvious rash, lesion, or ulcer.  DATA REVIEW:   CBC Recent Labs  Lab 12/18/17 0453  WBC 7.8  HGB 12.2*  HCT 34.6*  PLT 201    Chemistries  Recent Labs  Lab 12/18/17 0453  NA 136  K 3.9  CL 104  CO2 27  GLUCOSE 149*  BUN 41*  CREATININE 1.01  CALCIUM 8.4*  AST 36  ALT 33  ALKPHOS 42  BILITOT 0.8     Microbiology Results  No results found for this or any previous visit.  RADIOLOGY:  Dg Shoulder Right  Result Date: 12/17/2017 CLINICAL DATA:  Syncopal episode last night. Possible fall. Right shoulder pain. EXAM: RIGHT SHOULDER - 2+ VIEW COMPARISON:  None. FINDINGS: AP views with internal and external rotation. 2 Y-views, both mildly motion degraded. The mineralization and alignment are normal. There is no evidence of acute fracture or dislocation. There are prominent glenohumeral degenerative changes for age. There is mild AC joint hypertrophy and  subacromial spurring. IMPRESSION: No acute osseous findings. Age advanced glenohumeral degenerative changes. Electronically Signed   By: Carey Bullocks M.D.   On: 12/17/2017 15:44   Dg Hip Unilat With Pelvis 2-3 Views Left  Result Date: 12/17/2017 CLINICAL DATA:  Syncope, left hip pain EXAM: DG HIP (WITH OR WITHOUT PELVIS) 2-3V LEFT COMPARISON:  None. FINDINGS: No fracture or dislocation is seen. Bilateral hip joint spaces are preserved. Visualized bony pelvis appears intact. IMPRESSION: Negative. Electronically Signed   By: Charline Bills M.D.   On: 12/17/2017 15:44     Management plans discussed with the patient, his sister and they are in agreement.  CODE STATUS: Full Code   TOTAL TIME TAKING CARE OF THIS PATIENT: 36 minutes.    Shaune Pollack M.D on 12/18/2017 at 2:39 PM  Between 7am to 6pm - Pager - [phone]  After 6pm go to [email or web address] - Social research officer, government  [facility]  Office  [phone]  CC: Primary care physician; Patient, No Pcp Per   Note: This dictation was prepared with Dragon dictation along with smaller phrase technology. Any transcriptional errors that result from this process are unintentional.

## 2017-12-18 NOTE — Clinical Social Work Note (Signed)
Patient left prior to CSW being able to assess. Patient's nurse reported that patient denied cocaine use.  York SpanielMonica Pradeep Beaubrun MSW,LCSW [phone]

## 2017-12-18 NOTE — Progress Notes (Signed)
S Creatinine normal range this morning Spoke with Dr Imogene Burnhen Consult cancelled Please re-consult as necessary
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
## 2017-12-19 LAB — HIV ANTIBODY (ROUTINE TESTING W REFLEX): HIV SCREEN 4TH GENERATION: NONREACTIVE

## 2018-01-18 ENCOUNTER — Other Ambulatory Visit: Payer: Self-pay | Admitting: Student

## 2018-01-18 DIAGNOSIS — R1084 Generalized abdominal pain: Secondary | ICD-10-CM

## 2018-01-19 ENCOUNTER — Ambulatory Visit
Admission: RE | Admit: 2018-01-19 | Discharge: 2018-01-19 | Disposition: A | Discharge status: Home / Self Care | Payer: Medicaid Other | Source: Ambulatory Visit | Attending: Student | Admitting: Student

## 2018-01-19 ENCOUNTER — Other Ambulatory Visit
Admission: RE | Admit: 2018-01-19 | Discharge: 2018-01-19 | Disposition: A | Discharge status: Home / Self Care | Payer: Medicaid Other | Source: Ambulatory Visit | Attending: Student | Admitting: Student

## 2018-01-19 DIAGNOSIS — N200 Calculus of kidney: Secondary | ICD-10-CM | POA: Diagnosis not present

## 2018-01-19 DIAGNOSIS — R1084 Generalized abdominal pain: Secondary | ICD-10-CM | POA: Insufficient documentation

## 2018-01-19 DIAGNOSIS — I700 Atherosclerosis of aorta: Secondary | ICD-10-CM | POA: Diagnosis not present

## 2018-01-19 DIAGNOSIS — K5730 Diverticulosis of large intestine without perforation or abscess without bleeding: Secondary | ICD-10-CM | POA: Insufficient documentation

## 2018-01-19 LAB — GASTROINTESTINAL PANEL BY PCR, STOOL (REPLACES STOOL CULTURE)
ADENOVIRUS F40/41: NOT DETECTED
Astrovirus: NOT DETECTED
CRYPTOSPORIDIUM: NOT DETECTED
Campylobacter species: NOT DETECTED
Cyclospora cayetanensis: NOT DETECTED
ENTEROPATHOGENIC E COLI (EPEC): NOT DETECTED
Entamoeba histolytica: NOT DETECTED
Enteroaggregative E coli (EAEC): NOT DETECTED
Enterotoxigenic E coli (ETEC): NOT DETECTED
Giardia lamblia: NOT DETECTED
Norovirus GI/GII: NOT DETECTED
Plesimonas shigelloides: NOT DETECTED
ROTAVIRUS A: NOT DETECTED
SALMONELLA SPECIES: NOT DETECTED
SHIGELLA/ENTEROINVASIVE E COLI (EIEC): NOT DETECTED
Sapovirus (I, II, IV, and V): NOT DETECTED
Shiga like toxin producing E coli (STEC): NOT DETECTED
Vibrio cholerae: NOT DETECTED
Vibrio species: NOT DETECTED
YERSINIA ENTEROCOLITICA: NOT DETECTED

## 2018-01-19 LAB — CLOSTRIDIUM DIFFICILE BY PCR, REFLEXED: Toxigenic C. Difficile by PCR: POSITIVE — AB

## 2018-01-19 LAB — C DIFFICILE QUICK SCREEN W PCR REFLEX
C DIFFICILE (CDIFF) TOXIN: NEGATIVE
C Diff antigen: POSITIVE — AB

## 2018-01-19 MED ORDER — IOPAMIDOL (ISOVUE-370) INJECTION 76%
100.0000 mL | Freq: Once | INTRAVENOUS | Status: AC | PRN
  Administered 2018-01-19: 100 mL via INTRAVENOUS

## 2018-01-22 LAB — CALPROTECTIN, FECAL: Calprotectin, Fecal: 78 ug/g (ref 0–120)

## 2018-01-24 LAB — PANCREATIC ELASTASE, FECAL

## 2018-11-22 IMAGING — US US RENAL
1 series · 14 of 25 positions shown · non-contrast
Comparison: None.

CLINICAL DATA: Acute kidney injury

EXAM:
RENAL / URINARY TRACT ULTRASOUND COMPLETE

[Series 1: us renal · 0.30mm/px · 14 of 29 slices shown]
[im 1/29]
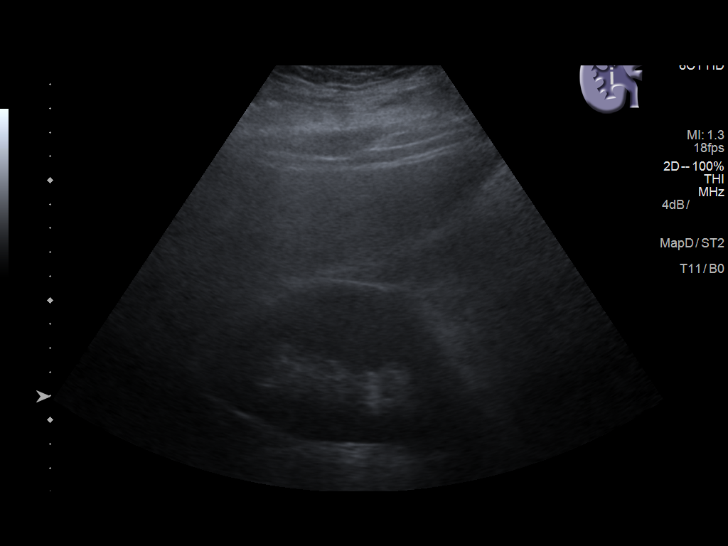
[im 3/29]
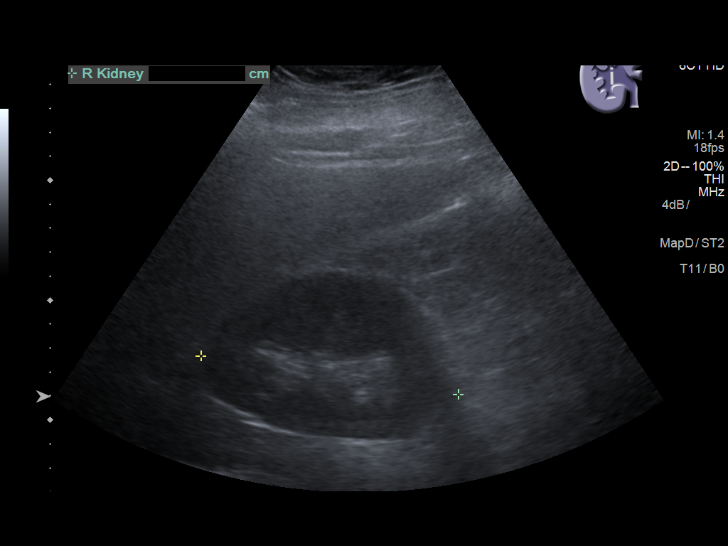
[im 5/29]
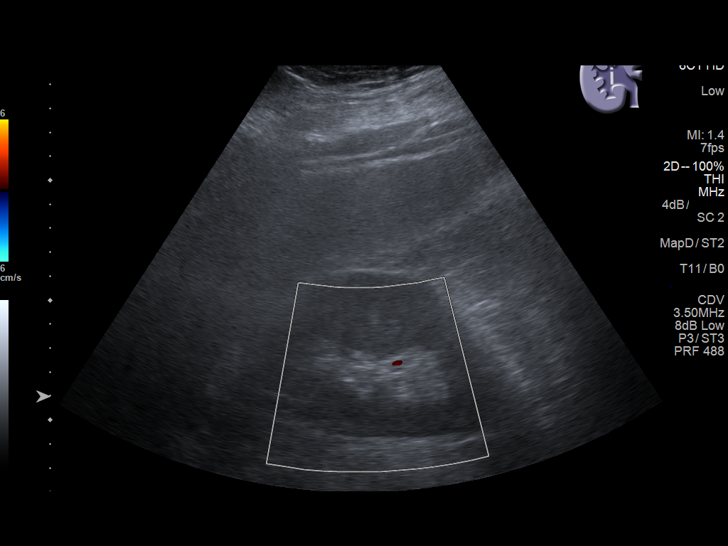
[im 8/29]
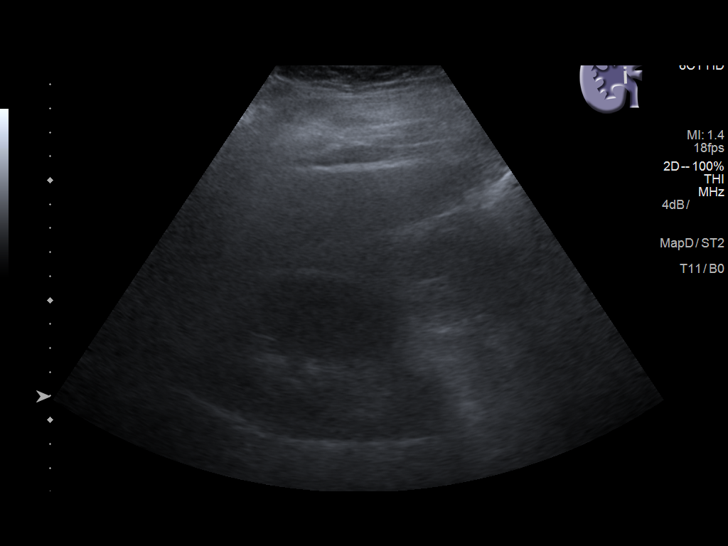
[im 10/29]
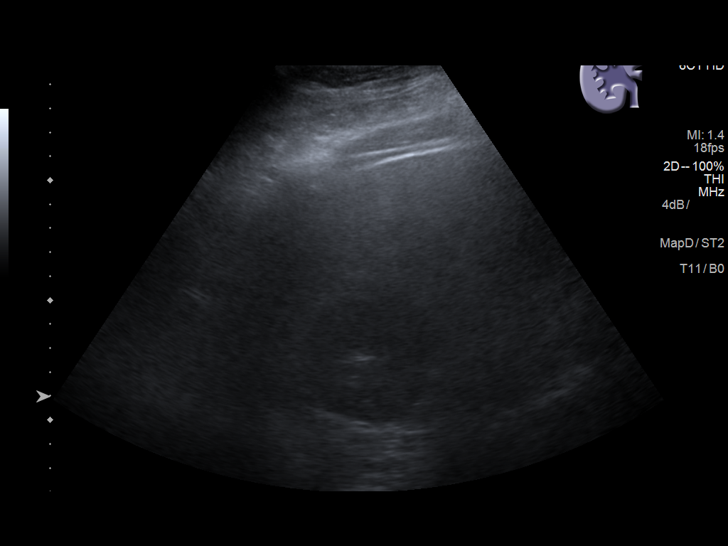
[im 11/29]
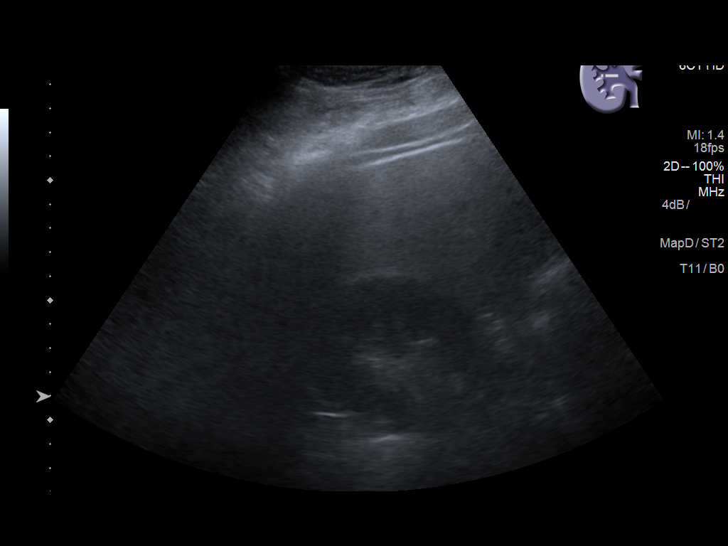
[im 13/29]
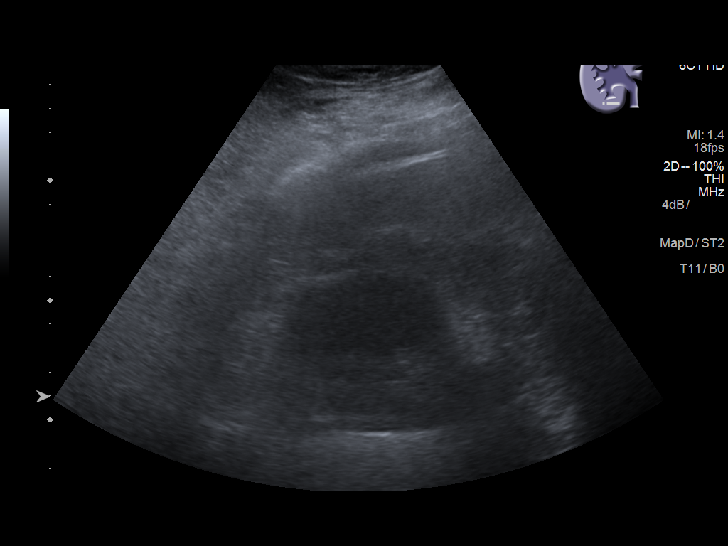
[im 16/29]
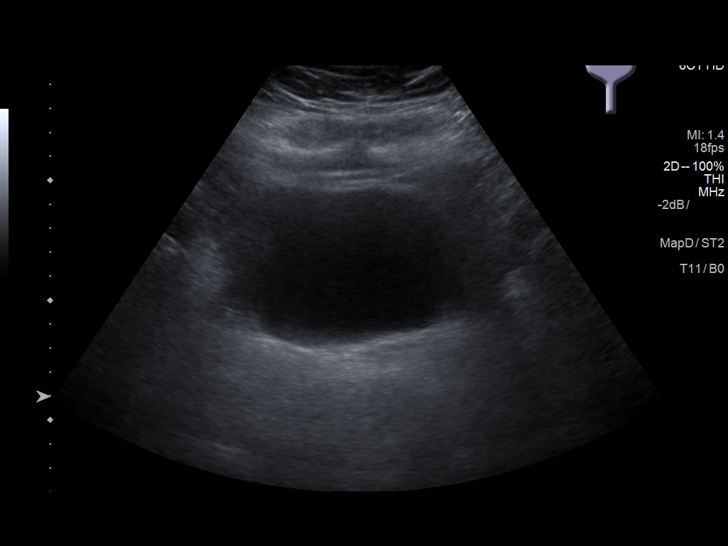
[im 18/29]
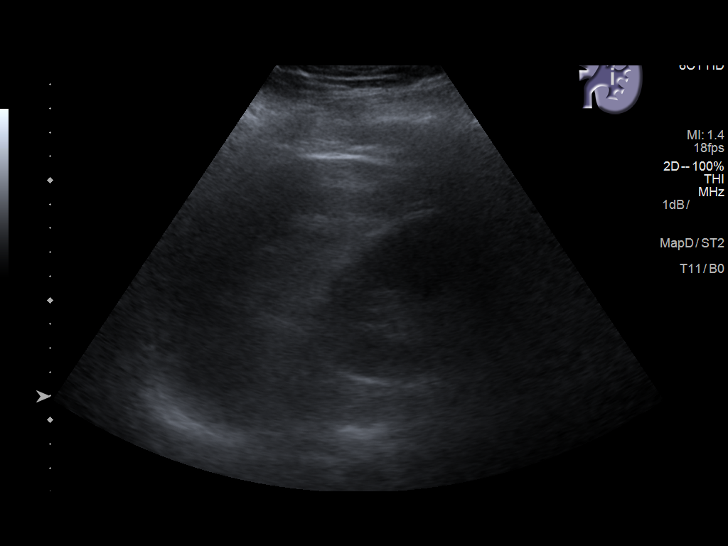
[im 19/29]
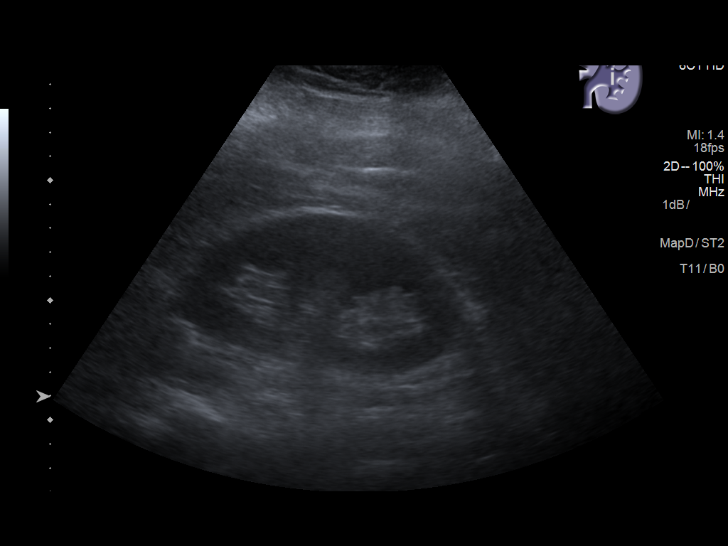
[im 22/29]
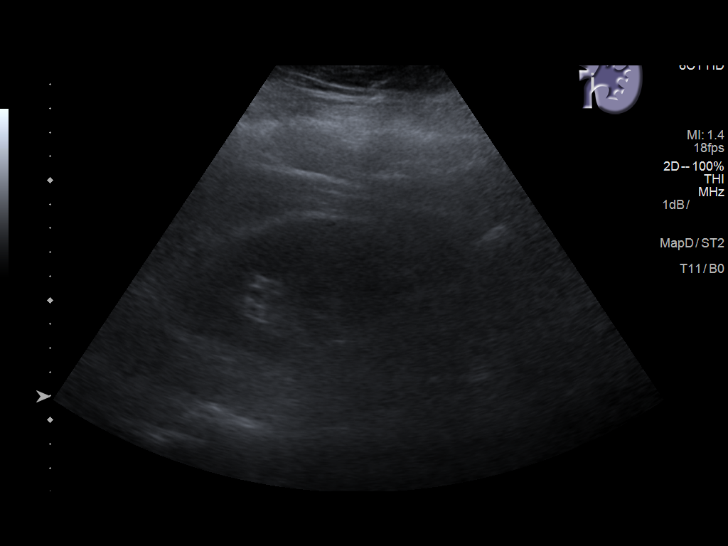
[im 24/29]
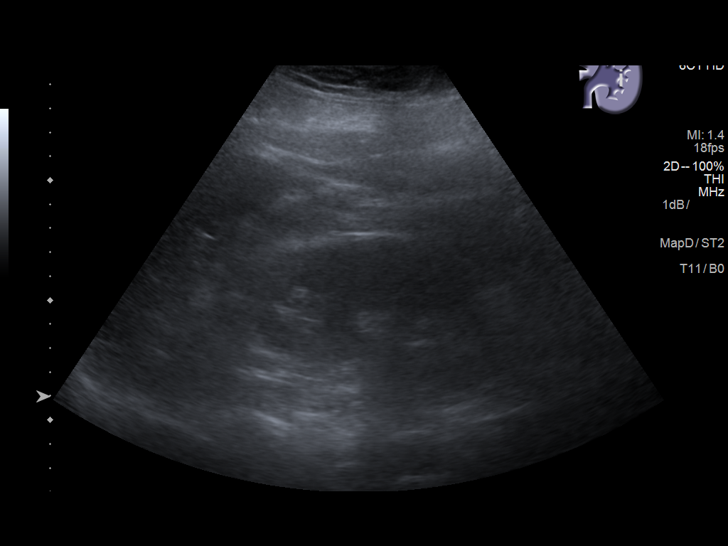
[im 26/29]
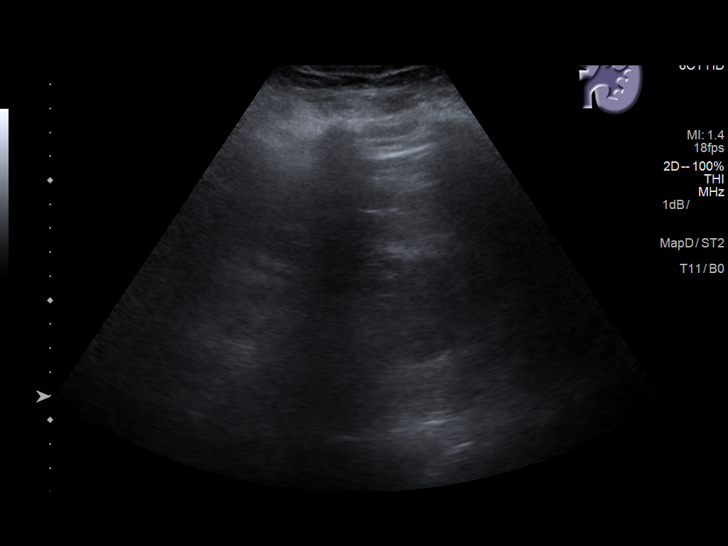
[im 29/29]
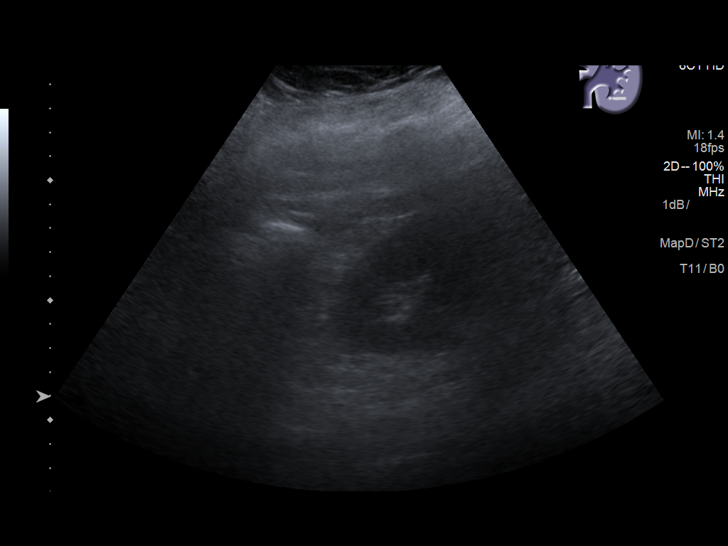

[14 of 25 positions shown; findings below may reference images not displayed]

FINDINGS: Right Kidney:

Length: 11.0 cm.  No mass or hydronephrosis.

Left Kidney:

Length: 12.2 cm.  No mass or hydronephrosis.

Bladder:

Within normal limits.
IMPRESSION: Negative renal ultrasound.

## 2020-10-23 IMAGING — CT CT ABD-PELV W/ CM
2 of 5 series · 16 of 46 positions shown, 18 images · IV contrast (APPLIED)
Comparison: None.

CLINICAL DATA: Left-sided abdominal pain over the last week. Nausea
and diarrhea. History of colitis.

EXAM:
CT ABDOMEN AND PELVIS WITH CONTRAST
TECHNIQUE: Multidetector CT imaging of the abdomen and pelvis was performed
using the standard protocol following bolus administration of
intravenous contrast.
CONTRAST:  100mL BE8Q1V-PND IOPAMIDOL (BE8Q1V-PND) INJECTION 76%

[Series 2: routine abd/pel with · axial · 0.90mm/px · z∈[-544,-39]mm · 13 of 113 slices shown, 15 images]
[im 6/113  soft-tissue]
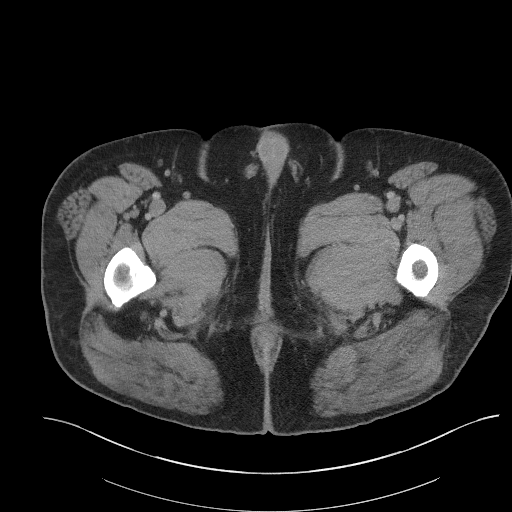
[im 6/113  bone]
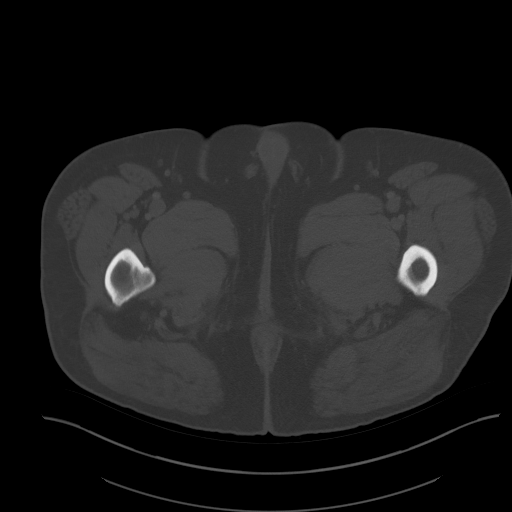
[im 18/113  soft-tissue]
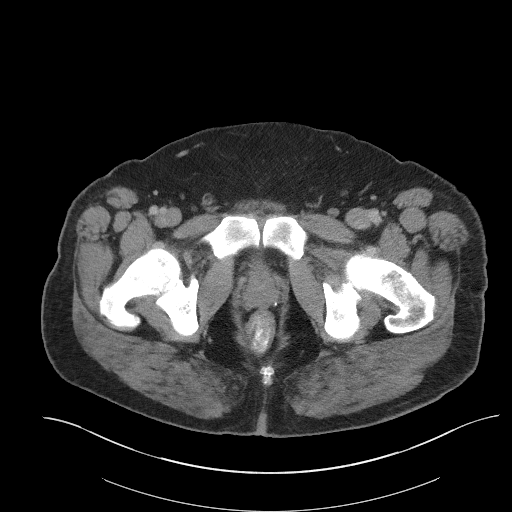
[im 24/113  soft-tissue]
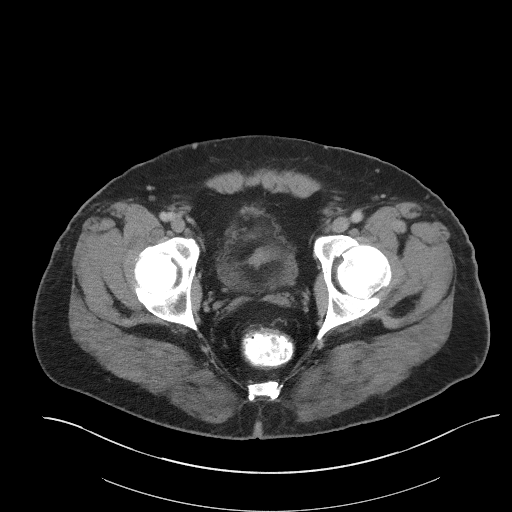
[im 30/113  soft-tissue]
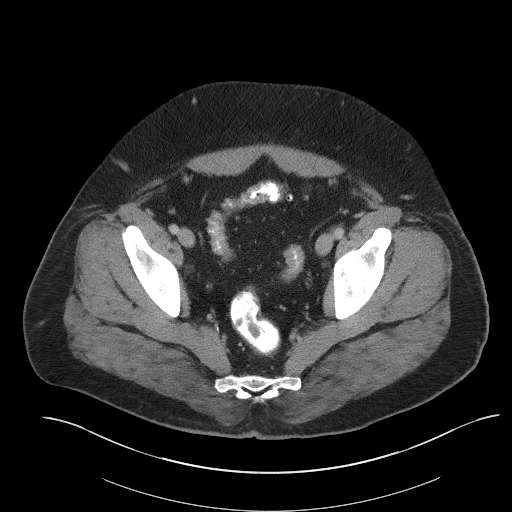
[im 42/113  soft-tissue]
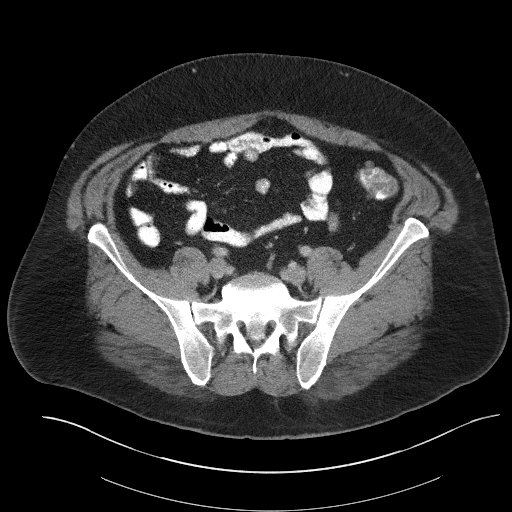
[im 48/113  soft-tissue]
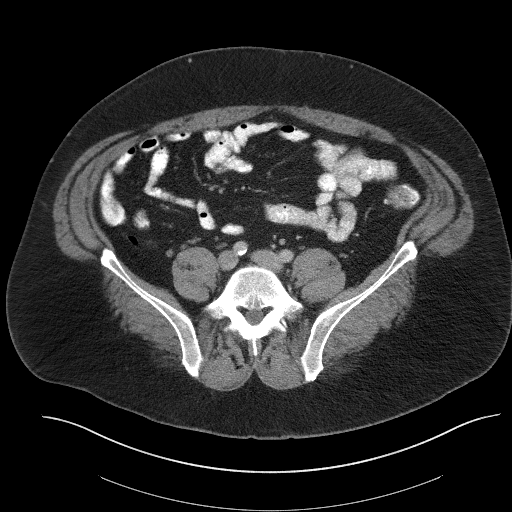
[im 59/113  soft-tissue]
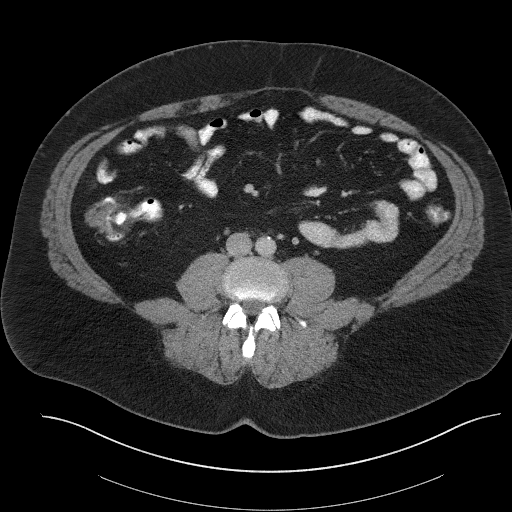
[im 65/113  soft-tissue]
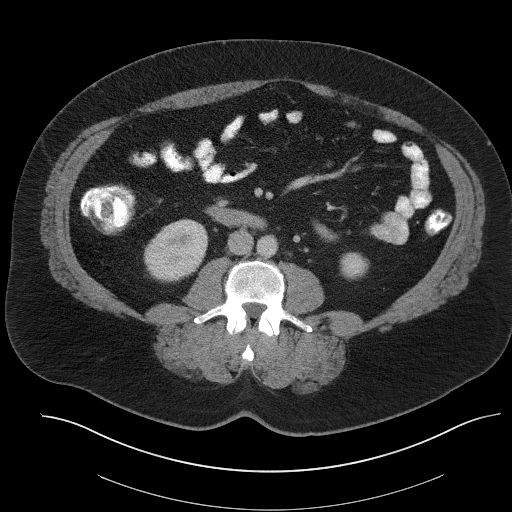
[im 71/113  soft-tissue]
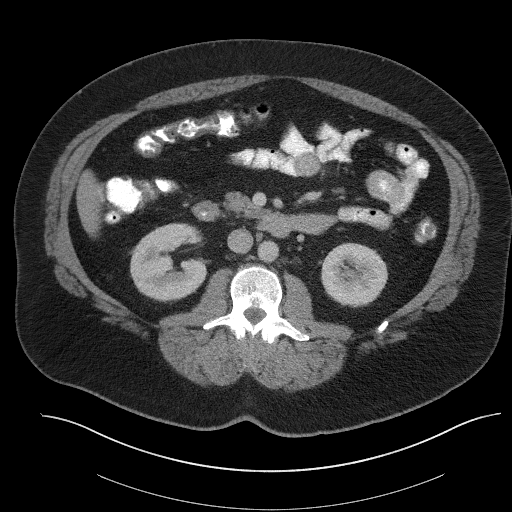
[im 71/113  bone]
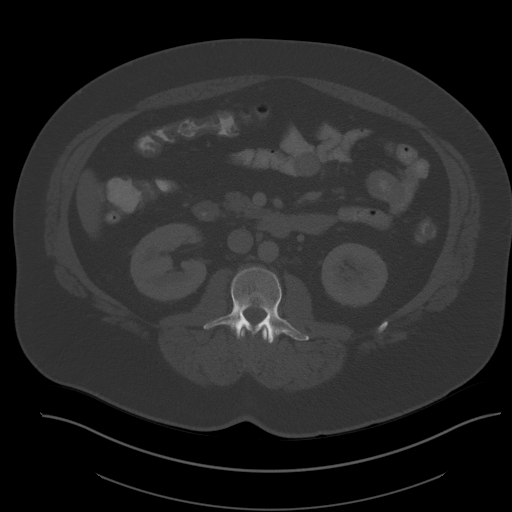
[im 83/113  soft-tissue]
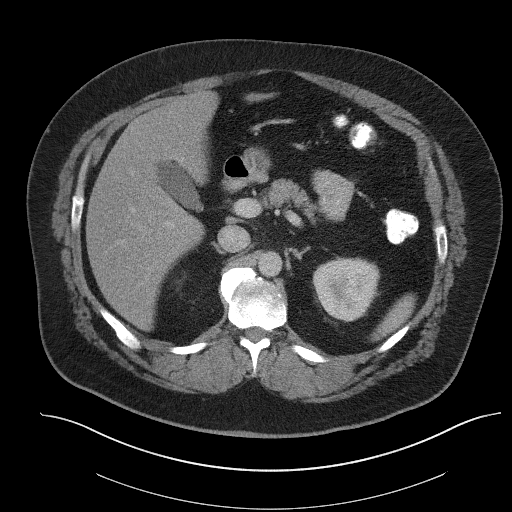
[im 89/113  soft-tissue]
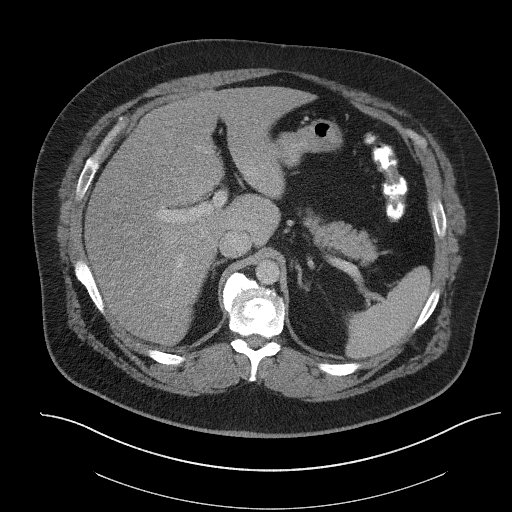
[im 95/113  soft-tissue]
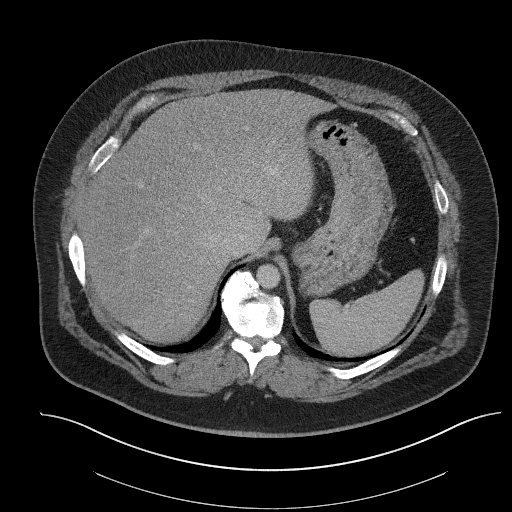
[im 107/113  soft-tissue]
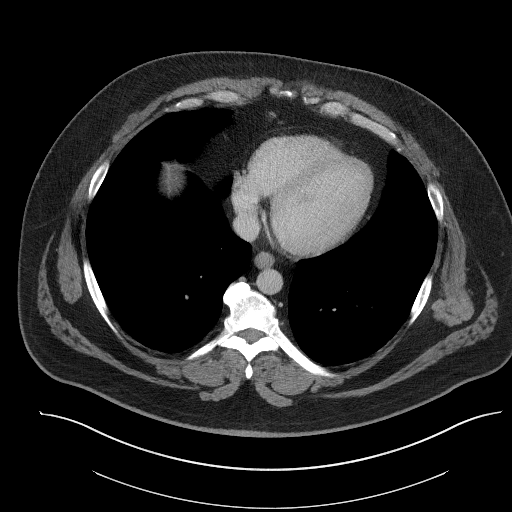

[Series 5: coronal st · coronal · 0.91mm/px · 3 of 119 slices shown]
[im 40/119  soft-tissue]
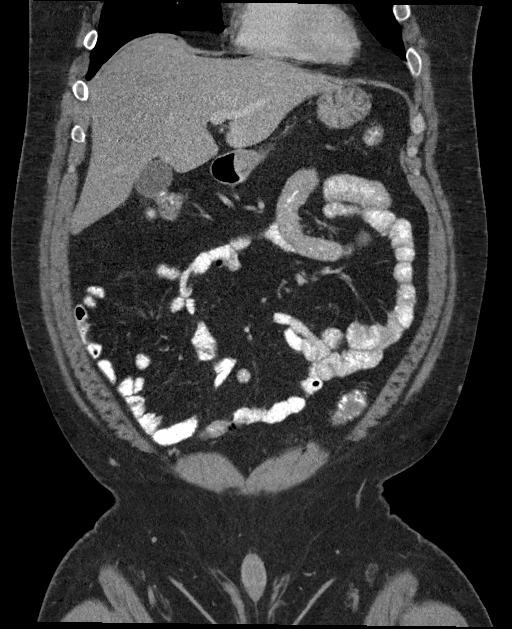
[im 53/119  soft-tissue]
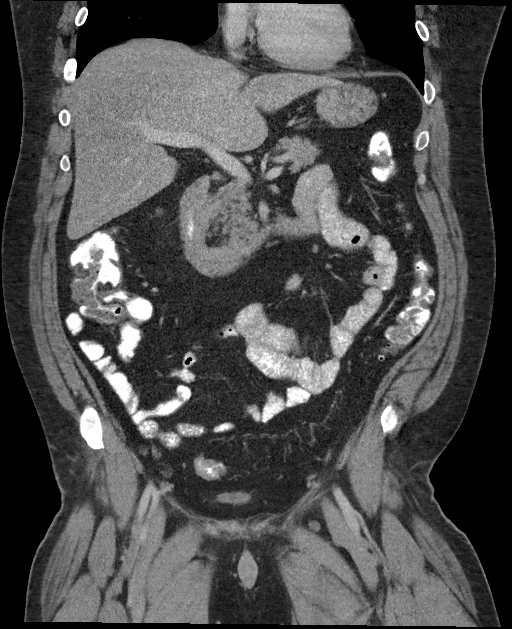
[im 66/119  soft-tissue]
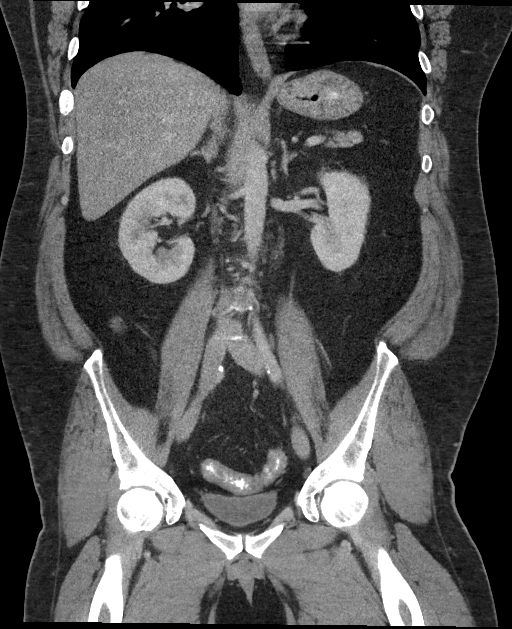

[16 of 46 positions shown; findings below may reference images not displayed]

FINDINGS: Lower chest: Normal

Hepatobiliary: Liver parenchyma is normal. No calcified gallstones.
No ductal dilatation.

Pancreas: Normal

Spleen: Normal

Adrenals/Urinary Tract: Adrenal glands are normal. Kidneys are
normal except for 2 punctate nonobstructing stones in the lower pole
of the left kidney. No mass, cyst or hydronephrosis. No sign of
passing stone. No stone in the bladder.

Stomach/Bowel: No evidence of bowel obstruction. Small bowel is
normal. I think the colon is within normal limits. The colon
contains a small amount of fecal matter. Contrast is present all the
way to the rectum. There is minor diverticulosis but no evidence of
diverticulitis.

Vascular/Lymphatic: Aortic atherosclerosis. Early findings. No
aneurysm. IVC is normal. No adenopathy.

Reproductive: Normal

Other: No free fluid or air.

Musculoskeletal: Chronic degenerative changes affect the spine with
prominent bridging osteophytes in the lower thoracic and upper
lumbar region that could indicate diffuse idiopathic skeletal
hyperostosis.
IMPRESSION: No acute finding to explain the clinical presentation. I do not see
definite evidence of acute bowel pathology. The patient has mild
diverticulosis of the sigmoid colon but I do not see any evidence of
diverticulitis or definitive colitis by CT.

Two tiny nonobstructing stones in the lower pole of the left kidney.

Early but definite atherosclerosis of the aorta and iliac vessels.
